# Patient Record
Sex: Male | Born: 1995 | Race: White | Hispanic: No | Marital: Single
Health system: Southern US, Community
[De-identification: ages and names within clinical notes are randomized; demographics above are authoritative.]

---
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
## 2019-05-03 ENCOUNTER — Ambulatory Visit: Payer: Self-pay | Attending: Internal Medicine

## 2019-05-03 DIAGNOSIS — Z23 Encounter for immunization: Secondary | ICD-10-CM

## 2019-05-03 NOTE — Progress Notes (Signed)
   [facility]  Name:  Logan Cochran    MRN: [id number] DOB: 02-01-1996  05/03/2019  [name] was observed post Covid-19 immunization for 15 minutes without incident. He was provided with Vaccine Information Sheet and instruction to access the V-Safe system.   [name] was instructed to call 911 with any severe reactions post vaccine: Marland Kitchen Difficulty breathing  . Swelling of face and throat  . A fast heartbeat  . A bad rash all over body  . Dizziness and weakness   Immunizations Administered    Name Date Dose VIS Date Route   Pfizer COVID-19 Vaccine 05/03/2019 12:37 PM 0.3 mL 01/22/2019 Intramuscular   Manufacturer: [employer], [employer]   Lot: [id number]   NDC: [address]-1

## 2019-05-24 ENCOUNTER — Ambulatory Visit: Payer: Self-pay | Attending: Internal Medicine

## 2019-05-24 DIAGNOSIS — Z23 Encounter for immunization: Secondary | ICD-10-CM

## 2019-05-24 NOTE — Progress Notes (Signed)
   Cov[facility] Vaccination [facility]  Name:  Logan Cochran    MRN: [id number] DOB: Jul 11, 1995  05/24/2019  [name] was observed post Covid-19 immunization for 15 minutes without incident. He was provided with Vaccine Information Sheet and instruction to access the V-Safe system.   [name] was instructed to call 911 with any severe reactions post vaccine: Marland Kitchen Difficulty breathing  . Swelling of face and throat  . A fast heartbeat  . A bad rash all over body  . Dizziness and weakness   Immunizations Administered    Name Date Dose VIS Date Route   Pfizer COVID-19 Vaccine 05/24/2019 11:41 AM 0.3 mL 01/22/2019 Intramuscular   Manufacturer: [employer], [employer]   Lot: [id number]   NDC: [address]-1
